# Patient Record
Sex: Female | Born: 1955 | Race: White | Hispanic: No | Marital: Married | Smoking: Former smoker
Health system: Southern US, Community
[De-identification: ages and names within clinical notes are randomized; demographics above are authoritative.]

## PROBLEM LIST (undated history)

## (undated) DIAGNOSIS — I10 Essential (primary) hypertension: Secondary | ICD-10-CM

## (undated) DIAGNOSIS — E119 Type 2 diabetes mellitus without complications: Secondary | ICD-10-CM

## (undated) DIAGNOSIS — K5790 Diverticulosis of intestine, part unspecified, without perforation or abscess without bleeding: Secondary | ICD-10-CM

## (undated) HISTORY — PX: TUBAL LIGATION: SHX77

## (undated) HISTORY — PX: ABDOMINAL SURGERY: SHX537

## (undated) HISTORY — DX: Diverticulosis of intestine, part unspecified, without perforation or abscess without bleeding: K57.90

---

## 2013-10-30 ENCOUNTER — Encounter: Payer: Self-pay | Admitting: Emergency Medicine

## 2013-10-30 ENCOUNTER — Emergency Department
Admission: EM | Admit: 2013-10-30 | Discharge: 2013-10-30 | Disposition: A | Discharge status: Home / Self Care | Payer: BC Managed Care – PPO | Source: Home / Self Care

## 2013-10-30 DIAGNOSIS — J209 Acute bronchitis, unspecified: Secondary | ICD-10-CM

## 2013-10-30 HISTORY — DX: Type 2 diabetes mellitus without complications: E11.9

## 2013-10-30 HISTORY — DX: Essential (primary) hypertension: I10

## 2013-10-30 MED ORDER — BENZONATATE 200 MG PO CAPS: 200.0000 mg | ORAL_CAPSULE | Freq: Every day | ORAL | Status: DC

## 2013-10-30 MED ORDER — AZITHROMYCIN 250 MG PO TABS: ORAL_TABLET | ORAL | Status: DC

## 2013-10-30 NOTE — ED Notes (Signed)
Cough, congestion, sneezing, hoarseness, bloody mucus x 6 days

## 2013-10-30 NOTE — ED Provider Notes (Signed)
CSN: [id number]     Arrival date & time 10/30/13  1443 History   None    Chief Complaint  Patient presents with  . URI      HPI Comments: Six days ago patient developed a non-productive cough suddenly without other symptoms.  She then developed a sore throat.  Three days later she developed sinus congestion, wheezing, and occasional shortness of breath.  She has had a burning sensation in her anterior chest.  She sometimes coughs until she gags.  She does not remember her last Tdap. She has a past history of several episodes of pneumonia.  The history is provided by the patient.    Past Medical History  Diagnosis Date  . Diabetes mellitus without complication   . Hypertension    Past Surgical History  Procedure Laterality Date  . Abdominal surgery    . Tubal ligation     No family history on file. History  Substance Use Topics  . Smoking status: Former Games developermoker  . Smokeless tobacco: Not on file  . Alcohol Use: Yes   OB History   Grav Para Term Preterm Abortions TAB SAB Ect Mult Living                 Review of Systems + sore throat + cough No pleuritic pain but has tightness in anterior chest + wheezing + nasal congestion + post-nasal drainage No sinus pain/pressure No itchy/red eyes No earache No hemoptysis + SOB No fever, ? chills No nausea No vomiting No abdominal pain No diarrhea No urinary symptoms No skin rash + fatigue ? myalgias + headache Used OTC meds without relief  Allergies  Review of patient's allergies indicates no known allergies.  Home Medications   Prior to Admission medications   Medication Sig Start Date End Date Taking? Authorizing Provider  aspirin 81 MG tablet Take 81 mg by mouth daily.   Yes Historical Provider, MD  calcium-vitamin D (OSCAL WITH D) 500-200 MG-UNIT per tablet Take 1 tablet by mouth.   Yes Historical Provider, MD  glipiZIDE (GLUCOTROL) 5 MG tablet Take by mouth daily before breakfast.   Yes Historical Provider,  MD  insulin detemir (LEVEMIR) 100 UNIT/ML injection Inject into the skin at bedtime.   Yes Historical Provider, MD  losartan (COZAAR) 50 MG tablet Take 50 mg by mouth daily.   Yes Historical Provider, MD  magnesium oxide (MAG-OX) 400 MG tablet Take 400 mg by mouth daily.   Yes Historical Provider, MD  metFORMIN (GLUCOPHAGE) 500 MG tablet Take by mouth 2 (two) times daily with a meal.   Yes Historical Provider, MD  vitamin B-12 (CYANOCOBALAMIN) 1000 MCG tablet Take 1,000 mcg by mouth daily.   Yes Historical Provider, MD  azithromycin (ZITHROMAX Z-PAK) 250 MG tablet Take 2 tabs today; then begin one tab once daily for 4 more days. 10/30/13   Lattie HawStephen A Beese, MD  benzonatate (TESSALON) 200 MG capsule Take 1 capsule (200 mg total) by mouth at bedtime. Take as needed for cough 10/30/13   Lattie HawStephen A Beese, MD   BP 150/87  Pulse 88  Temp(Src) 98.4 F (36.9 C) (Oral)  Ht 5\' 6"  (1.676 m)  Wt 174 lb (78.926 kg)  BMI 28.10 kg/m2  SpO2 96% Physical Exam Nursing notes and Vital Signs reviewed. Appearance:  Patient appears healthy, stated age, and in no acute distress Eyes:  Pupils are equal, round, and reactive to light and accomodation.  Extraocular movement is intact.  Conjunctivae are not inflamed  Ears:  Canals normal.  Tympanic membranes normal.  Nose:  Mildly congested turbinates.  No sinus tenderness.   Pharynx:  Normal Neck:  Supple.  Slightly tender shotty posterior nodes are palpated bilaterally  Lungs:  Clear to auscultation.  Breath sounds are equal.  Heart:  Regular rate and rhythm without murmurs, rubs, or gallops.  Abdomen:  Nontender without masses or hepatosplenomegaly.  Bowel sounds are present.  No CVA or flank tenderness.  Extremities:  No edema.  No calf tenderness Skin:  No rash present.   ED Course  Procedures  none   MDM   1. Acute bronchitis, unspecified organism; ?atypical agent    Begin Z-pack to cover atypicals.  Prescription written for Benzonatate [facility](Tessalon) to  take at bedtime for night-time cough.  Take plain Mucinex (1200 mg guaifenesin) twice daily for cough and congestion.  Increase fluid intake, rest. May use Afrin nasal spray (or generic oxymetazoline) twice daily for about 5 days.  Also recommend using saline nasal spray several times daily and saline nasal irrigation (AYR is a common brand) Try warm salt water gargles for sore throat.  Stop all antihistamines for now, and other non-prescription cough/cold preparations. Recommend a Tdap when well.  Follow-up with family doctor if not improving 7 to 10 days.     Lattie HawStephen A Beese, MD 10/30/13 2000

## 2013-10-30 NOTE — Discharge Instructions (Signed)
Take plain Mucinex (1200 mg guaifenesin) twice daily for cough and congestion.  Increase fluid intake, rest. May use Afrin nasal spray (or generic oxymetazoline) twice daily for about 5 days.  Also recommend using saline nasal spray several times daily and saline nasal irrigation (AYR is a common brand) Try warm salt water gargles for sore throat.  Stop all antihistamines for now, and other non-prescription cough/cold preparations. Recommend a Tdap when well.  Follow-up with family doctor if not improving 7 to 10 days.

## 2013-11-04 ENCOUNTER — Telehealth: Payer: Self-pay

## 2013-11-04 NOTE — ED Notes (Signed)
Left a message on voice mail asking how patient is feeling and advising to call back with any questions or concerns.  

## 2015-07-28 DIAGNOSIS — E119 Type 2 diabetes mellitus without complications: Secondary | ICD-10-CM | POA: Insufficient documentation

## 2015-07-28 DIAGNOSIS — I10 Essential (primary) hypertension: Secondary | ICD-10-CM | POA: Insufficient documentation

## 2015-07-28 DIAGNOSIS — E782 Mixed hyperlipidemia: Secondary | ICD-10-CM | POA: Insufficient documentation

## 2019-06-30 ENCOUNTER — Ambulatory Visit: Payer: Medicare Other | Attending: Internal Medicine

## 2019-06-30 DIAGNOSIS — Z23 Encounter for immunization: Secondary | ICD-10-CM | POA: Insufficient documentation

## 2019-06-30 NOTE — Progress Notes (Signed)
   Covid-19 Vaccination Clinic  Name:  Barbara Wagner    MRN: [id number] DOB: [birth year]  06/30/2019  [name] was observed post Covid-19 immunization for 15 minutes without incidence. She was provided with Vaccine Information Sheet and instruction to access the V-Safe system.   [name] was instructed to call 911 with any severe reactions post vaccine: Marland Kitchen Difficulty breathing  . Swelling of your face and throat  . A fast heartbeat  . A bad rash all over your body  . Dizziness and weakness    Immunizations Administered    Name Date Dose VIS Date Route   Pfizer COVID-19 Vaccine 06/30/2019  1:24 PM 0.3 mL 04/19/2019 Intramuscular   Manufacturer: [employer], [employer]   Lot: [id number]   NDC: [address]-9

## 2019-07-24 ENCOUNTER — Ambulatory Visit: Payer: Medicare Other | Attending: Internal Medicine

## 2019-07-24 DIAGNOSIS — Z23 Encounter for immunization: Secondary | ICD-10-CM

## 2019-07-24 NOTE — Progress Notes (Signed)
   Covid-19 Vaccination Clinic  Name:  Barbara Wagner    MRN: [id number] DOB: Sep 09, 1955  07/24/2019  [name] was observed post Covid-19 immunization for 15 minutes without incident. She was provided with Vaccine Information Sheet and instruction to access the V-Safe system.   [name] was instructed to call 911 with any severe reactions post vaccine: Marland Kitchen Difficulty breathing  . Swelling of face and throat  . A fast heartbeat  . A bad rash all over body  . Dizziness and weakness   Immunizations Administered    Name Date Dose VIS Date Route   Pfizer COVID-19 Vaccine 07/24/2019  1:10 PM 0.3 mL 04/19/2019 Intramuscular   Manufacturer: [employer], [employer]   Lot: [id number]   NDC: [address]-8

## 2019-10-22 DIAGNOSIS — T466X5A Adverse effect of antihyperlipidemic and antiarteriosclerotic drugs, initial encounter: Secondary | ICD-10-CM | POA: Insufficient documentation

## 2021-08-24 ENCOUNTER — Emergency Department (HOSPITAL_BASED_OUTPATIENT_CLINIC_OR_DEPARTMENT_OTHER): Payer: Medicare Other

## 2021-08-24 ENCOUNTER — Emergency Department (HOSPITAL_BASED_OUTPATIENT_CLINIC_OR_DEPARTMENT_OTHER)
Admission: EM | Admit: 2021-08-24 | Discharge: 2021-08-24 | Disposition: A | Discharge status: Home / Self Care | Payer: Medicare Other | Attending: Emergency Medicine | Admitting: Emergency Medicine

## 2021-08-24 ENCOUNTER — Other Ambulatory Visit (HOSPITAL_BASED_OUTPATIENT_CLINIC_OR_DEPARTMENT_OTHER): Payer: Self-pay

## 2021-08-24 ENCOUNTER — Other Ambulatory Visit: Payer: Self-pay

## 2021-08-24 ENCOUNTER — Encounter (HOSPITAL_BASED_OUTPATIENT_CLINIC_OR_DEPARTMENT_OTHER): Payer: Self-pay | Admitting: Emergency Medicine

## 2021-08-24 DIAGNOSIS — I10 Essential (primary) hypertension: Secondary | ICD-10-CM | POA: Insufficient documentation

## 2021-08-24 DIAGNOSIS — Z7984 Long term (current) use of oral hypoglycemic drugs: Secondary | ICD-10-CM | POA: Insufficient documentation

## 2021-08-24 DIAGNOSIS — Z79899 Other long term (current) drug therapy: Secondary | ICD-10-CM | POA: Insufficient documentation

## 2021-08-24 DIAGNOSIS — N3001 Acute cystitis with hematuria: Secondary | ICD-10-CM | POA: Diagnosis not present

## 2021-08-24 DIAGNOSIS — Z87891 Personal history of nicotine dependence: Secondary | ICD-10-CM | POA: Diagnosis not present

## 2021-08-24 DIAGNOSIS — E119 Type 2 diabetes mellitus without complications: Secondary | ICD-10-CM | POA: Insufficient documentation

## 2021-08-24 DIAGNOSIS — Z7982 Long term (current) use of aspirin: Secondary | ICD-10-CM | POA: Insufficient documentation

## 2021-08-24 DIAGNOSIS — R319 Hematuria, unspecified: Secondary | ICD-10-CM | POA: Diagnosis present

## 2021-08-24 DIAGNOSIS — Z794 Long term (current) use of insulin: Secondary | ICD-10-CM | POA: Diagnosis not present

## 2021-08-24 LAB — COMPREHENSIVE METABOLIC PANEL
ALT: 19 U/L (ref 0–44)
AST: 15 U/L (ref 15–41)
Albumin: 3.9 g/dL (ref 3.5–5.0)
Alkaline Phosphatase: 46 U/L (ref 38–126)
Anion gap: 9 (ref 5–15)
BUN: 12 mg/dL (ref 8–23)
CO2: 24 mmol/L (ref 22–32)
Calcium: 8.8 mg/dL — ABNORMAL LOW (ref 8.9–10.3)
Chloride: 105 mmol/L (ref 98–111)
Creatinine, Ser: 0.69 mg/dL (ref 0.44–1.00)
GFR, Estimated: >60 mL/min (ref >60)
Glucose, Bld: 193 mg/dL — ABNORMAL HIGH (ref 70–99)
Potassium: 4.2 mmol/L (ref 3.5–5.1)
Sodium: 138 mmol/L (ref 135–145)
Total Bilirubin: 0.7 mg/dL (ref 0.3–1.2)
Total Protein: 7 g/dL (ref 6.5–8.1)

## 2021-08-24 LAB — URINALYSIS, ROUTINE W REFLEX MICROSCOPIC
Bilirubin Urine: NEGATIVE
Glucose, UA: NEGATIVE mg/dL
Ketones, ur: NEGATIVE mg/dL
Nitrite: POSITIVE — AB
Protein, ur: NEGATIVE mg/dL
Specific Gravity, Urine: 1.025 (ref 1.005–1.030)
pH: 5.5 (ref 5.0–8.0)

## 2021-08-24 LAB — CBC WITH DIFFERENTIAL/PLATELET
Abs Immature Granulocytes: 0.02 10*3/uL (ref 0.00–0.07)
Basophils Absolute: 0 10*3/uL (ref 0.0–0.1)
Basophils Relative: 1 %
Eosinophils Absolute: 0.1 10*3/uL (ref 0.0–0.5)
Eosinophils Relative: 2 %
HCT: 42.2 % (ref 36.0–46.0)
Hemoglobin: 13.5 g/dL (ref 12.0–15.0)
Immature Granulocytes: 0 %
Lymphocytes Relative: 14 %
Lymphs Abs: 1 10*3/uL (ref 0.7–4.0)
MCH: 27.4 pg (ref 26.0–34.0)
MCHC: 32 g/dL (ref 30.0–36.0)
MCV: 85.6 fL (ref 80.0–100.0)
Monocytes Absolute: 0.6 10*3/uL (ref 0.1–1.0)
Monocytes Relative: 8 %
Neutro Abs: 5.1 10*3/uL (ref 1.7–7.7)
Neutrophils Relative %: 75 %
Platelets: 244 10*3/uL (ref 150–400)
RBC: 4.93 MIL/uL (ref 3.87–5.11)
RDW: 13.9 % (ref 11.5–15.5)
WBC: 6.9 10*3/uL (ref 4.0–10.5)
nRBC: 0 % (ref 0.0–0.2)

## 2021-08-24 LAB — URINALYSIS, MICROSCOPIC (REFLEX): RBC / HPF: >50 RBC/hpf (ref 0–5)

## 2021-08-24 LAB — LIPASE, BLOOD: Lipase: 28 U/L (ref 11–51)

## 2021-08-24 MED ORDER — SODIUM CHLORIDE 0.9 % IV SOLN
1.0000 g | Freq: Once | INTRAVENOUS | Status: AC
  Administered 2021-08-24: 1 g via INTRAVENOUS
  Filled 2021-08-24: qty 10

## 2021-08-24 MED ORDER — CEPHALEXIN 500 MG PO CAPS
500.0000 mg | ORAL_CAPSULE | Freq: Two times a day (BID) | ORAL | 0 refills | Status: AC
  Filled 2021-08-24: qty 14, 7d supply, fill #0

## 2021-08-24 NOTE — ED Triage Notes (Signed)
Pt reports hematuria and urinary frequency that started last night. Denies pain.  ?

## 2021-08-24 NOTE — ED Provider Notes (Signed)
?[facility] EMERGENCY DEPARTMENT ?Provider Note ? ? ?CSN: [id number] ?Arrival date & time: 08/24/21  1032 ? ?  ? ?History ? ?Chief Complaint  ?Patient presents with  ? Hematuria  ? ? ?Barbara Wagner is a 66 y.o. female. ? ?Patient here with some urinary frequency, some blood in her urine.  Denies any abdominal pain.  History of diabetes and high blood pressure.  Not on blood thinners.  Not having difficulty emptying her bladder.  Former smoker. Some intermittent left flank pain during the last 3 weeks ? ? ?The history is provided by the patient.  ?Hematuria ?This is a new problem. The current episode started yesterday. The problem has not changed since onset.Pertinent negatives include no chest pain, no abdominal pain, no headaches and no shortness of breath. Nothing aggravates the symptoms. Nothing relieves the symptoms. She has tried nothing for the symptoms. The treatment provided no relief.  ? ?  ? ?Home Medications ?Prior to Admission medications   ?Medication Sig Start Date End Date Taking? Authorizing Provider  ?cephALEXin (KEFLEX) 500 MG capsule Take 1 capsule (500 mg total) by mouth 2 (two) times daily for 7 days. 08/24/21 08/31/21 Yes Arleigh Dicola, DO  ?aspirin 81 MG tablet Take 81 mg by mouth daily.    [provider]  ?azithromycin (ZITHROMAX Z-PAK) 250 MG tablet Take 2 tabs today; then begin one tab once daily for 4 more days. 10/30/13   Lattie HawBeese, Stephen A, MD  ?benzonatate (TESSALON) 200 MG capsule Take 1 capsule (200 mg total) by mouth at bedtime. Take as needed for cough 10/30/13   Lattie HawBeese, Stephen A, MD  ?calcium-vitamin D (OSCAL WITH D) 500-200 MG-UNIT per tablet Take 1 tablet by mouth.    [provider]  ?glipiZIDE (GLUCOTROL) 5 MG tablet Take by mouth daily before breakfast.    [provider]  ?insulin detemir (LEVEMIR) 100 UNIT/ML injection Inject into the skin at bedtime.    [provider]  ?losartan (COZAAR) 50 MG tablet Take 50 mg by mouth daily.     [provider]  ?magnesium oxide (MAG-OX) 400 MG tablet Take 400 mg by mouth daily.    [provider]  ?metFORMIN (GLUCOPHAGE) 500 MG tablet Take by mouth 2 (two) times daily with a meal.    [provider]  ?vitamin B-12 (CYANOCOBALAMIN) 1000 MCG tablet Take 1,000 mcg by mouth daily.    [provider]  ?   ? ?Allergies    ?Patient has no known allergies.   ? ?Review of Systems   ?Review of Systems  ?Respiratory:  Negative for shortness of breath.   ?Cardiovascular:  Negative for chest pain.  ?Gastrointestinal:  Negative for abdominal pain.  ?Genitourinary:  Positive for hematuria.  ?Neurological:  Negative for headaches.  ? ?Physical Exam ?Updated Vital Signs ? ?ED Triage Vitals  ?Enc Vitals Group  ?   BP 08/24/21 1042 (!) 168/91  ?   Pulse Rate 08/24/21 1042 93  ?   Resp 08/24/21 1042 18  ?   Temp 08/24/21 1042 98.5 ?F (36.9 ?C)  ?   Temp Source 08/24/21 1042 Oral  ?   SpO2 08/24/21 1042 96 %  ?   Weight --   ?   Height --   ?   Head Circumference --   ?   Peak Flow --   ?   Pain Score 08/24/21 1042 0  ?   Pain Loc --   ?   Pain Edu? --   ?  Excl. in GC? --   ? ? ?Physical Exam ?Vitals and nursing note reviewed.  ?Constitutional:   ?   General: She is not in acute distress. ?   Appearance: She is well-developed. She is not ill-appearing.  ?HENT:  ?   Head: Normocephalic and atraumatic.  ?   Nose: Nose normal.  ?   Mouth/Throat:  ?   Mouth: Mucous membranes are moist.  ?Eyes:  ?   Extraocular Movements: Extraocular movements intact.  ?   Conjunctiva/sclera: Conjunctivae normal.  ?   Pupils: Pupils are equal, round, and reactive to light.  ?Cardiovascular:  ?   Rate and Rhythm: Normal rate and regular rhythm.  ?   Pulses: Normal pulses.  ?   Heart sounds: Normal heart sounds. No murmur heard. ?Pulmonary:  ?   Effort: Pulmonary effort is normal. No respiratory distress.  ?   Breath sounds: Normal breath sounds.  ?Abdominal:  ?   General: Abdomen is flat.  ?   Palpations:  Abdomen is soft.  ?   Tenderness: There is no abdominal tenderness.  ?Musculoskeletal:     ?   General: No swelling. Normal range of motion.  ?   Cervical back: Neck supple.  ?Skin: ?   General: Skin is warm and dry.  ?   Capillary Refill: Capillary refill takes less than 2 seconds.  ?Neurological:  ?   General: No focal deficit present.  ?   Mental Status: She is alert.  ?Psychiatric:     ?   Mood and Affect: Mood normal.  ? ? ?ED Results / Procedures / Treatments   ?Labs ?(all labs ordered are listed, but only abnormal results are displayed) ?Labs Reviewed  ?URINALYSIS, ROUTINE W REFLEX MICROSCOPIC - Abnormal; Notable for the following components:  ?    Result Value  ? APPearance HAZY (*)   ? Hgb urine dipstick LARGE (*)   ? Nitrite POSITIVE (*)   ? Leukocytes,Ua SMALL (*)   ? All other components within normal limits  ?COMPREHENSIVE METABOLIC PANEL - Abnormal; Notable for the following components:  ? Glucose, Bld 193 (*)   ? Calcium 8.8 (*)   ? All other components within normal limits  ?URINALYSIS, MICROSCOPIC (REFLEX) - Abnormal; Notable for the following components:  ? Bacteria, UA MANY (*)   ? All other components within normal limits  ?URINE CULTURE  ?CBC WITH DIFFERENTIAL/PLATELET  ?LIPASE, BLOOD  ? ? ?EKG ?None ? ?Radiology ?CT Renal Stone Study ? ?Result Date: 08/24/2021 ?CLINICAL DATA:  Left flank pain, kidney stone suspected. EXAM: CT ABDOMEN AND PELVIS WITHOUT CONTRAST TECHNIQUE: Multidetector CT imaging of the abdomen and pelvis was performed following the standard protocol without IV contrast. RADIATION DOSE REDUCTION: This exam was performed according to the departmental dose-optimization program which includes automated exposure control, adjustment of the mA and/or kV according to patient size and/or use of iterative reconstruction technique. COMPARISON:  None. FINDINGS: Lower chest: No acute abnormality. Hepatobiliary: No suspicious hepatic lesion on this noncontrast examination. Gallbladder is  unremarkable. No biliary ductal dilation. Pancreas: No pancreatic ductal dilation or evidence of acute inflammation. Spleen: No splenomegaly or focal splenic lesion. Adrenals/Urinary Tract: Bilateral adrenal glands are within normal limits. No hydronephrosis. No renal, ureteral or bladder calculi identified. Mild wall thickening of an incompletely distended urinary bladder. Stomach/Bowel: No enteric contrast was administered. Stomach is unremarkable for degree of distension. No pathologic dilation of small large bowel. The appendix and terminal ileum appear normal. Left-sided colonic diverticulosis without findings  of acute diverticulitis. Vascular/Lymphatic: Aortic and branch vessel atherosclerosis without abdominal aortic aneurysm. No pathologically enlarged abdominal or pelvic lymph nodes. Pelvic phleboliths. Reproductive: Uterus and bilateral adnexa are unremarkable. Other: No significant abdominopelvic free fluid. Musculoskeletal: Multilevel degenerative changes of the spine. Mild degenerative changes of the bilateral hips and SI joints. Chronic changes of the pubic symphysis. IMPRESSION: 1. No acute abdominopelvic findings. Specifically, no evidence of obstructive uropathy. 2. Left-sided colonic diverticulosis without findings of acute diverticulitis. 3. Mild wall thickening of an incompletely distended urinary bladder, which may represent cystitis. Correlate with urinalysis. 4.  Aortic Atherosclerosis (ICD10-I70.0). Electronically Signed   By: Maudry Mayhew M.D.   On: 08/24/2021 11:46   ? ?Procedures ?Procedures  ? ? ?Medications Ordered in ED ?Medications  ?cefTRIAXone (ROCEPHIN) 1 g in sodium chloride 0.9 % 100 mL IVPB (has no administration in time range)  ? ? ?ED Course/ Medical Decision Making/ A&P ?  ?                        ?Medical Decision Making ?Amount and/or Complexity of Data Reviewed ?Labs: ordered. ?Radiology: ordered. ? ?Risk ?Prescription drug management. ? ? ?Abiola Behring is a 66 year old  female who presents to the ED with hematuria and flank pain.  No history of kidney stones.  Recently treated for shingles.  History of diabetes, former smoker.  Overall she appears comfortable.  Not having any severe

## 2021-08-26 LAB — URINE CULTURE: Culture: >=100000 — AB

## 2022-08-17 LAB — ESTIMATED GFR: GFR, Est Non African American: 88

## 2022-08-17 LAB — MICROALBUMIN / CREATININE URINE RATIO
Creatinine, Urine.: 87
Microalb, Ur: <7
Microalb, Ur: 0

## 2022-09-12 ENCOUNTER — Ambulatory Visit (INDEPENDENT_AMBULATORY_CARE_PROVIDER_SITE_OTHER): Payer: Medicare Other | Admitting: Family Medicine

## 2022-09-12 ENCOUNTER — Other Ambulatory Visit: Payer: Self-pay | Admitting: Family Medicine

## 2022-09-12 ENCOUNTER — Encounter: Payer: Self-pay | Admitting: Family Medicine

## 2022-09-12 VITALS — BP 147/72 | HR 82 | Temp 97.9°F | Resp 18 | Ht 66.0 in | Wt 172.1 lb

## 2022-09-12 DIAGNOSIS — Z794 Long term (current) use of insulin: Secondary | ICD-10-CM | POA: Diagnosis not present

## 2022-09-12 DIAGNOSIS — E119 Type 2 diabetes mellitus without complications: Secondary | ICD-10-CM | POA: Diagnosis not present

## 2022-09-12 DIAGNOSIS — J3489 Other specified disorders of nose and nasal sinuses: Secondary | ICD-10-CM

## 2022-09-12 DIAGNOSIS — Z7689 Persons encountering health services in other specified circumstances: Secondary | ICD-10-CM

## 2022-09-12 DIAGNOSIS — Z23 Encounter for immunization: Secondary | ICD-10-CM | POA: Diagnosis not present

## 2022-09-12 NOTE — Progress Notes (Signed)
New Patient Office Visit  Subjective    Patient ID: Barbara Wagner, female    DOB: [birth year]  Age: 67 y.o. MRN: [id number]  CC:  Chief Complaint  Patient presents with   Establish Care    Patient states that she is a new patient here to establish care, She states that she doesn't have any major concerns at this time other than some post nasal drainage and a scratchy voice she states could be allergy related    HPI Barbara Wagner presents to establish care  Pt reports she has been diabetic since her 14s. She is on Basaglar 26 units BID and Metformin 1000mg  BID. She is using aspirin 81 mg daily. She is seeing endocrinologist every 4 months. She has had A1c at 7.3 last month. She sees [facility] for diabetic eye exam and had this done in March 2024. She reports this was good.   She reports she has postnasal drainage. This is causing her voice to be hoarse. She isn't on any medicine for allergies.   She reports hematuria last year. She went to ER and had CT scan. She was diagnosed with UTI and given Keflex.   Pt would like PCV-20, she declines Colonoscopy, Mammogram.   Outpatient Encounter Medications as of 09/12/2022  Medication Sig   aspirin 81 MG tablet Take 81 mg by mouth daily.   Blood Glucose Monitoring Suppl (GLUCOCOM BLOOD GLUCOSE MONITOR) DEVI USE TO TEST 4 TIMES DAILY. DX CODE E11.9   calcium-vitamin D (OSCAL WITH D) 500-200 MG-UNIT per tablet Take 1 tablet by mouth.   CRANBERRY PO Take by mouth.   Insulin Glargine (BASAGLAR KWIKPEN) 100 UNIT/ML Inject 30 Units into the skin 2 (two) times daily.   losartan (COZAAR) 50 MG tablet Take 50 mg by mouth daily.   magnesium oxide (MAG-OX) 400 MG tablet Take 400 mg by mouth daily.   metFORMIN (GLUCOPHAGE) 500 MG tablet Take 1,000 mg by mouth 2 (two) times daily with a meal.   Multiple Vitamins-Minerals (PRESERVISION AREDS 2 PO) Take by mouth.   TURMERIC PO Take by mouth.   vitamin B-12 (CYANOCOBALAMIN) 1000 MCG tablet Take 1,000 mcg by mouth  daily.   [DISCONTINUED] azithromycin (ZITHROMAX Z-PAK) 250 MG tablet Take 2 tabs today; then begin one tab once daily for 4 more days.   [DISCONTINUED] benzonatate (TESSALON) 200 MG capsule Take 1 capsule (200 mg total) by mouth at bedtime. Take as needed for cough   [DISCONTINUED] glipiZIDE (GLUCOTROL) 5 MG tablet Take by mouth daily before breakfast.   [DISCONTINUED] insulin detemir (LEVEMIR) 100 UNIT/ML injection Inject into the skin at bedtime.   [DISCONTINUED] metFORMIN (GLUCOPHAGE) 500 MG tablet Take by mouth 2 (two) times daily with a meal.   No facility-administered encounter medications on file as of 09/12/2022.    Past Medical History:  Diagnosis Date   Diabetes mellitus without complication (HCC)    Diverticulosis    Hypertension     Past Surgical History:  Procedure Laterality Date   ABDOMINAL SURGERY     TUBAL LIGATION      History reviewed. No pertinent family history.  Social History   Socioeconomic History   Marital status: Married    Spouse name: Not on file   Number of children: Not on file   Years of education: Not on file   Highest education level: Not on file  Occupational History   Not on file  Tobacco Use   Smoking status: Former   Smokeless tobacco: Not on  file  Substance and Sexual Activity   Alcohol use: Yes   Drug use: Not on file   Sexual activity: Not on file  Other Topics Concern   Not on file  Social History Narrative   Not on file   Social Determinants of Health   Financial Resource Strain: Not on file  Food Insecurity: Not on file  Transportation Needs: Not on file  Physical Activity: Not on file  Stress: Not on file  Social Connections: Not on file  Intimate Partner Violence: Not on file    Review of Systems  HENT:         Postnasal drainage  All other systems reviewed and are negative.      Objective    BP (!) 147/72   Pulse 82   Temp 97.9 F (36.6 C) (Oral)   Resp 18   Ht 5\' 6"  (1.676 m)   Wt 172 lb 1.6 oz  (78.1 kg)   SpO2 98%   BMI 27.78 kg/m   Physical Exam Vitals and nursing note reviewed.  Constitutional:      Appearance: Normal appearance. She is normal weight.  HENT:     Head: Normocephalic and atraumatic.     Right Ear: External ear normal.     Left Ear: External ear normal.     Nose: Nose normal.     Mouth/Throat:     Mouth: Mucous membranes are moist.     Pharynx: Oropharynx is clear.  Eyes:     Conjunctiva/sclera: Conjunctivae normal.     Pupils: Pupils are equal, round, and reactive to light.  Cardiovascular:     Rate and Rhythm: Normal rate and regular rhythm.     Pulses: Normal pulses.     Heart sounds: Normal heart sounds.  Pulmonary:     Effort: Pulmonary effort is normal.     Breath sounds: Normal breath sounds.  Abdominal:     General: Abdomen is flat. Bowel sounds are normal.  Skin:    General: Skin is warm.     Capillary Refill: Capillary refill takes less than 2 seconds.  Neurological:     General: No focal deficit present.     Mental Status: She is alert and oriented to person, place, and time. Mental status is at baseline.  Psychiatric:        Mood and Affect: Mood normal.        Behavior: Behavior normal.        Thought Content: Thought content normal.        Judgment: Judgment normal.       Assessment & Plan:   Problem List Items Addressed This Visit   None  Encounter to establish care with new doctor  Type 2 diabetes mellitus without complication, with long-term current use of insulin (HCC)  Need for vaccination against Streptococcus pneumoniae -     Pneumococcal conjugate vaccine 20-valent  Sinus drainage  PCV-20 today Pt to continue to see Endocrinologist for routine follow up of her Diabetes May add flonase for Sinus/postnasal drainage prn.  To see back in 4 weeks for AWV.  No follow-ups on file.   Suzan Slick, MD

## 2022-10-11 ENCOUNTER — Encounter: Payer: Self-pay | Admitting: Family Medicine

## 2022-10-11 ENCOUNTER — Ambulatory Visit (INDEPENDENT_AMBULATORY_CARE_PROVIDER_SITE_OTHER): Payer: Medicare Other | Admitting: Family Medicine

## 2022-10-11 VITALS — BP 127/79 | HR 81 | Temp 97.7°F | Resp 18 | Ht 66.0 in | Wt 172.6 lb

## 2022-10-11 DIAGNOSIS — Z0000 Encounter for general adult medical examination without abnormal findings: Secondary | ICD-10-CM | POA: Diagnosis not present

## 2022-10-11 DIAGNOSIS — Z1211 Encounter for screening for malignant neoplasm of colon: Secondary | ICD-10-CM

## 2022-10-11 NOTE — Progress Notes (Signed)
Annual Wellness Visit     Patient: Barbara Wagner, Female    DOB: 19-May-1955, 67 y.o.   MRN: [id number]  Subjective  Chief Complaint  Patient presents with   Medicare Annual wellness exam    Patient states that she has a lot of nasal congestion she states that she can't seem to get rid of.    Barbara Wagner is a 67 y.o. female who presents today for her Annual Wellness Visit. She reports consuming a  diabetic  diet. The patient does not participate in regular exercise at present. She generally feels well. She reports sleeping well. She does not have additional problems to discuss today.   HPI Pt declines mammogram. She reports she has had cyst of her left breast that was biopsied. She had a bad experience and didn't want to have anymore mammograms.  She would like to do Cologuard. She does report a change in bowel movements in the last 3 months. She reports hemorrhoids and fissures many years ago.  Pt does drink water but not active. Declines dexa scan.  Vision:Within last year  Opioid Use/abuse: no opioid usage  Patient Active Problem List   Diagnosis Date Noted   Myalgia due to statin 10/22/2019   Essential hypertension 07/28/2015   Mixed hyperlipidemia 07/28/2015   Type 2 diabetes mellitus without complication, with long-term current use of insulin (HCC) 07/28/2015   Past Medical History:  Diagnosis Date   Diabetes mellitus without complication (HCC)    Diverticulosis    Hypertension    Past Surgical History:  Procedure Laterality Date   ABDOMINAL SURGERY     TUBAL LIGATION     Social History   Tobacco Use   Smoking status: Former  Substance Use Topics   Alcohol use: Yes   Family Status  Relation Name Status   Mother  Deceased   Father  Deceased   Family History  Problem Relation Age of Onset   Stroke Father    Allergies  Allergen Reactions   Pollen Extract Other (See Comments)      Medications: Outpatient Medications Prior to Visit  Medication Sig    aspirin 81 MG tablet Take 81 mg by mouth daily.   Blood Glucose Monitoring Suppl (GLUCOCOM BLOOD GLUCOSE MONITOR) DEVI USE TO TEST 4 TIMES DAILY. DX CODE E11.9   calcium-vitamin D (OSCAL WITH D) 500-200 MG-UNIT per tablet Take 1 tablet by mouth.   CRANBERRY PO Take by mouth.   Insulin Glargine (BASAGLAR KWIKPEN) 100 UNIT/ML Inject 30 Units into the skin 2 (two) times daily.   losartan (COZAAR) 50 MG tablet Take 50 mg by mouth daily.   magnesium oxide (MAG-OX) 400 MG tablet Take 400 mg by mouth daily.   metFORMIN (GLUCOPHAGE) 500 MG tablet Take 1,000 mg by mouth 2 (two) times daily with a meal.   Multiple Vitamins-Minerals (PRESERVISION AREDS 2 PO) Take by mouth.   TURMERIC PO Take by mouth.   vitamin B-12 (CYANOCOBALAMIN) 1000 MCG tablet Take 1,000 mcg by mouth daily.   No facility-administered medications prior to visit.    Allergies  Allergen Reactions   Pollen Extract Other (See Comments)    Patient Care Team: Suzan Slick, MD as PCP - General (Family Medicine)  Review of Systems  HENT:  Positive for congestion and sinus pain.   Genitourinary:        Change in bowel movements  All other systems reviewed and are negative.       Objective  BP 127/79  Pulse 81   Temp 97.7 F (36.5 C) (Oral)   Resp 18   Ht 5\' 6"  (1.676 m)   Wt 172 lb 9.6 oz (78.3 kg)   SpO2 96%   BMI 27.86 kg/m  BP Readings from Last 3 Encounters:  10/11/22 127/79  09/12/22 (!) 147/72  08/24/21 (!) 145/74      Physical Exam Vitals and nursing note reviewed.  Constitutional:      Appearance: Normal appearance. She is normal weight.  HENT:     Head: Normocephalic and atraumatic.     Right Ear: External ear normal.     Left Ear: External ear normal.     Nose: Nose normal.     Mouth/Throat:     Mouth: Mucous membranes are moist.     Pharynx: Oropharynx is clear.  Eyes:     Conjunctiva/sclera: Conjunctivae normal.     Pupils: Pupils are equal, round, and reactive to light.   Cardiovascular:     Rate and Rhythm: Normal rate and regular rhythm.     Pulses: Normal pulses.     Heart sounds: Normal heart sounds.  Pulmonary:     Effort: Pulmonary effort is normal.     Breath sounds: Normal breath sounds.  Abdominal:     General: Abdomen is flat. Bowel sounds are normal.  Skin:    General: Skin is warm.     Capillary Refill: Capillary refill takes less than 2 seconds.  Neurological:     General: No focal deficit present.     Mental Status: She is alert and oriented to person, place, and time. Mental status is at baseline.  Psychiatric:        Mood and Affect: Mood normal.        Behavior: Behavior normal.        Thought Content: Thought content normal.        Judgment: Judgment normal.      Most recent functional status assessment:     No data to display         Most recent fall risk assessment:    09/12/2022    9:07 AM  Fall Risk   Falls in the past year? 0  Number falls in past yr: 0  Injury with Fall? 0  Risk for fall due to : No Fall Risks  Follow up Falls evaluation completed    Most recent depression screenings:    09/12/2022    9:08 AM  PHQ 2/9 Scores  PHQ - 2 Score 0  PHQ- 9 Score 3   Most recent cognitive screening:     No data to display         Most recent Audit-C alcohol use screening     No data to display         A score of 3 or more in women, and 4 or more in men indicates increased risk for alcohol abuse, EXCEPT if all of the points are from question 1   Vision/Hearing Screen: No results found.  Last CBC Lab Results  Component Value Date   WBC 6.9 08/24/2021   HGB 13.5 08/24/2021   HCT 42.2 08/24/2021   MCV 85.6 08/24/2021   MCH 27.4 08/24/2021   RDW 13.9 08/24/2021   PLT 244 08/24/2021   Last metabolic panel Lab Results  Component Value Date   GLUCOSE 193 (H) 08/24/2021   NA 138 08/24/2021   K 4.2 08/24/2021   CL 105 08/24/2021   CO2 24 08/24/2021  BUN 12 08/24/2021   CREATININE 0.69  08/24/2021   GFRNONAA 88 08/17/2022   CALCIUM 8.8 (L) 08/24/2021   PROT 7.0 08/24/2021   ALBUMIN 3.9 08/24/2021   BILITOT 0.7 08/24/2021   ALKPHOS 46 08/24/2021   AST 15 08/24/2021   ALT 19 08/24/2021   ANIONGAP 9 08/24/2021   Last lipids No results found for: "CHOL", "HDL", "LDLCALC", "LDLDIRECT", "TRIG", "CHOLHDL" Last hemoglobin A1c No results found for: "HGBA1C"    No results found for any visits on 10/11/22.    Assessment & Plan   Annual wellness visit done today including the all of the following: Reviewed patient's Family Medical History Reviewed and updated list of patient's medical providers Assessment of cognitive impairment was done Assessed patient's functional ability Established a written schedule for health screening services Health Risk Assessent Completed and Reviewed  Exercise Activities and Dietary recommendations  Goals   None     Immunization History  Administered Date(s) Administered   PFIZER(Purple Top)SARS-COV-2 Vaccination 06/30/2019, 07/24/2019   PNEUMOCOCCAL CONJUGATE-20 09/12/2022    Health Maintenance  Topic Date Due   Hepatitis C Screening  Never done   DTaP/Tdap/Td (1 - Tdap) Never done   Zoster Vaccines- Shingrix (1 of 2) Never done   DEXA SCAN  Never done   COVID-19 Vaccine (3 - 2023-24 season) 01/07/2022   MAMMOGRAM  09/12/2023 (Originally 12/28/2003)   Colonoscopy  09/12/2023 (Originally 12/28/1998)   INFLUENZA VACCINE  12/08/2022   HEMOGLOBIN A1C  02/21/2023   OPHTHALMOLOGY EXAM  08/05/2023   Diabetic kidney evaluation - eGFR measurement  08/17/2023   Diabetic kidney evaluation - Urine ACR  08/17/2023   FOOT EXAM  08/22/2023   Medicare Annual Wellness (AWV)  10/11/2023   Pneumonia Vaccine 24+ Years old  Completed   HPV VACCINES  Aged Out     Discussed health benefits of physical activity, and encouraged her to engage in regular exercise appropriate for her age and condition.    Problem List Items Addressed This Visit    None Visit Diagnoses     Medicare annual wellness visit, subsequent    -  Primary   Encounter for osteoporosis screening in asymptomatic postmenopausal patient       Screening for colon cancer       Relevant Orders   Cologuard     Labs are up to date, just had done with Endo in April reviewed and abstracted. Send cologuard to home Declines vaccines including shingles and covid Declines mammogram and dexa scan at this time  No follow-ups on file.     Suzan Slick, MD

## 2023-10-10 LAB — COMPREHENSIVE METABOLIC PANEL WITH GFR: eGFR: 80

## 2023-10-10 LAB — PROTEIN / CREATININE RATIO, URINE
Albumin, U: 26
Creatinine, Urine: 180

## 2023-10-10 LAB — MICROALBUMIN / CREATININE URINE RATIO: Microalb Creat Ratio: 14

## 2023-10-16 LAB — HEMOGLOBIN A1C: Hemoglobin A1C: 7.8

## 2023-11-06 ENCOUNTER — Other Ambulatory Visit: Payer: Self-pay | Admitting: Family Medicine

## 2023-11-06 ENCOUNTER — Ambulatory Visit (INDEPENDENT_AMBULATORY_CARE_PROVIDER_SITE_OTHER): Admitting: Family Medicine

## 2023-11-06 ENCOUNTER — Encounter: Payer: Self-pay | Admitting: Family Medicine

## 2023-11-06 VITALS — BP 127/75 | HR 79 | Temp 98.2°F | Resp 18 | Ht 63.0 in | Wt 174.6 lb

## 2023-11-06 DIAGNOSIS — Z0000 Encounter for general adult medical examination without abnormal findings: Secondary | ICD-10-CM | POA: Diagnosis not present

## 2023-11-06 NOTE — Progress Notes (Signed)
 Subjective:   Barbara Wagner is a 68 y.o. female who presents for Medicare Annual (Subsequent) preventive examination.  Visit Complete: In person  Patient Medicare AWV questionnaire was completed by the patient on 11/06/23; I have confirmed that all information answered by patient is correct and no changes since this date.  Cardiac Risk Factors include: diabetes mellitus;hypertension     Objective:    Today's Vitals   11/06/23 1341  BP: 127/75  Pulse: 79  Resp: 18  Temp: 98.2 F (36.8 C)  TempSrc: Oral  SpO2: 95%  Weight: 174 lb 9.6 oz (79.2 kg)  Height: 5' 3 (1.6 m)   Body mass index is 30.93 kg/m.     11/06/2023    1:40 PM 09/12/2022    9:08 AM 08/24/2021   10:42 AM  Advanced Directives  Does Patient Have a Medical Advance Directive? Yes Yes No  Type of Estate agent of [place];Living will Healthcare Power of [place];Living will   Does patient want to make changes to medical advance directive? No - Patient declined    Copy of Healthcare Power of Attorney in Chart? No - copy requested      Current Medications (verified) Outpatient Encounter Medications as of 11/06/2023  Medication Sig   aspirin 81 MG tablet Take 81 mg by mouth daily.   Blood Glucose Monitoring Suppl (GLUCOCOM BLOOD GLUCOSE MONITOR) DEVI USE TO TEST 4 TIMES DAILY. DX CODE E11.9   Cholecalciferol (VITAMIN D-3 PO) Take by mouth.   CRANBERRY PO Take by mouth.   folic acid-vitamin b complex-vitamin c-selenium-zinc (DIALYVITE) 3 MG TABS tablet Take 1 tablet by mouth daily.   Insulin Glargine (BASAGLAR KWIKPEN) 100 UNIT/ML Inject 30 Units into the skin 2 (two) times daily.   losartan (COZAAR) 50 MG tablet Take 50 mg by mouth daily.   magnesium oxide (MAG-OX) 400 MG tablet Take 400 mg by mouth daily.   metFORMIN (GLUCOPHAGE) 500 MG tablet Take 1,000 mg by mouth 2 (two) times daily with a meal.   Multiple Vitamins-Minerals (PRESERVISION AREDS 2 PO) Take by mouth.   TURMERIC PO Take by  mouth.   vitamin B-12 (CYANOCOBALAMIN) 1000 MCG tablet Take 1,000 mcg by mouth daily.   [DISCONTINUED] calcium-vitamin D (OSCAL WITH D) 500-200 MG-UNIT per tablet Take 1 tablet by mouth.   No facility-administered encounter medications on file as of 11/06/2023.    Allergies (verified) Pollen extract   History: Past Medical History:  Diagnosis Date   Diabetes mellitus without complication (HCC)    Diverticulosis    Hypertension    Past Surgical History:  Procedure Laterality Date   ABDOMINAL SURGERY     TUBAL LIGATION     Family History  Problem Relation Age of Onset   Graves' disease Mother    Stroke Father    Social History   Socioeconomic History   Marital status: Married    Spouse name: Not on file   Number of children: Not on file   Years of education: Not on file   Highest education level: Some college, no degree  Occupational History   Not on file  Tobacco Use   Smoking status: Former   Smokeless tobacco: Not on file  Substance and Sexual Activity   Alcohol use: Yes   Drug use: Not on file   Sexual activity: Not on file  Other Topics Concern   Not on file  Social History Narrative   Not on file   Social Drivers of Health   Financial  Resource Strain: Low Risk  (11/05/2023)   Overall Financial Resource Strain (CARDIA)    Difficulty of Paying Living Expenses: Not hard at all  Food Insecurity: No Food Insecurity (11/05/2023)   Hunger Vital Sign    Worried About Running Out of Food in the Last Year: Never true    Ran Out of Food in the Last Year: Never true  Transportation Needs: No Transportation Needs (11/05/2023)   PRAPARE - Administrator, Civil Service (Medical): No    Lack of Transportation (Non-Medical): No  Physical Activity: Insufficiently Active (11/05/2023)   Exercise Vital Sign    Days of Exercise per Week: 3 days    Minutes of Exercise per Session: 20 min  Stress: Stress Concern Present (11/05/2023)   [employer] of  Occupational Health - Occupational Stress Questionnaire    Feeling of Stress: To some extent  Social Connections: Moderately Integrated (11/05/2023)   Social Connection and Isolation Panel    Frequency of Communication with Friends and Family: More than three times a week    Frequency of Social Gatherings with Friends and Family: More than three times a week    Attends Religious Services: 1 to 4 times per year    Active Member of [employer] or Organizations: No    Attends Engineer, structural: Not on file    Marital Status: Married    Tobacco Counseling Counseling given: Not Answered   Clinical Intake:  Pre-visit preparation completed: Yes  Pain : No/denies pain     BMI - recorded: 27 Nutritional Status: BMI 25 -29 Overweight Nutritional Risks: None Diabetes: Yes CBG done?: Yes CBG resulted in Enter/ Edit results?: Yes Did pt. bring in CBG monitor from home?: Yes Glucose Meter Downloaded?: No  How often do you need to have someone help you when you read instructions, pamphlets, or other written materials from your doctor or pharmacy?: 1 - Never What is the last grade level you completed in school?: 1 year college  Interpreter Needed?: No      Activities of Daily Living    11/06/2023    1:39 PM 11/05/2023   11:52 PM  In your present state of health, do you have any difficulty performing the following activities:  Hearing? 0 0  Comment left ear hearing loss   Vision? 0 0  Difficulty concentrating or making decisions? 0 0  Walking or climbing stairs? 0 0  Dressing or bathing? 0 0  Doing errands, shopping? 0 0  Preparing Food and eating ? N N  Using the Toilet? N N  In the past six months, have you accidently leaked urine? Y Y  Do you have problems with loss of bowel control? N N  Managing your Medications? N N  Managing your Finances? N N  Housekeeping or managing your Housekeeping? N N    Patient Care Team: Colette Torrence GRADE, MD as PCP - General (Family  Medicine)  Indicate any recent Medical Services you may have received from other than [facility] providers in the past year (date may be approximate).     Assessment:   This is a routine wellness examination for Barbara Wagner.  Hearing/Vision screen No results found.   Goals Addressed             This Visit's Progress    Patient Stated       Enjoy the time I have left with family and friends       Depression Screen    11/06/2023  1:37 PM 09/12/2022    9:08 AM  PHQ 2/9 Scores  PHQ - 2 Score 1 0  PHQ- 9 Score  3    Fall Risk    11/06/2023    1:41 PM 11/05/2023   11:52 PM 09/12/2022    9:07 AM  Fall Risk   Falls in the past year? 0 0 0  Number falls in past yr: 0 0 0  Injury with Fall? 0 0 0  Risk for fall due to : No Fall Risks  No Fall Risks  Follow up Falls evaluation completed  Falls evaluation completed    MEDICARE RISK AT HOME: Medicare Risk at Home Any stairs in or around the home?: Yes If so, are there any without handrails?: Yes Home free of loose throw rugs in walkways, pet beds, electrical cords, etc?: Yes Adequate lighting in your home to reduce risk of falls?: Yes Life alert?: No Use of a cane, walker or w/c?: No Grab bars in the bathroom?: Yes Shower chair or bench in shower?: Yes Elevated toilet seat or a handicapped toilet?: No  TIMED UP AND GO:  Was the test performed?  Yes  Length of time to ambulate 10 feet: 8 sec Gait steady and fast without use of assistive device    Cognitive Function:        11/06/2023    1:42 PM  6CIT Screen  What Year? 0 points  What month? 0 points  What time? 0 points  Count back from 20 0 points  Months in reverse 0 points  Repeat phrase 0 points  Total Score 0 points    Immunizations Immunization History  Administered Date(s) Administered   PFIZER(Purple Top)SARS-COV-2 Vaccination 06/30/2019, 07/24/2019   PNEUMOCOCCAL CONJUGATE-20 09/12/2022    TDAP status: Up to date  Flu Vaccine status: Up to  date  Pneumococcal vaccine status: Up to date  Covid-19 vaccine status: Completed vaccines  Qualifies for Shingles Vaccine? No   Zostavax completed No   Shingrix Completed?: No.    Education has been provided regarding the importance of this vaccine. Patient has been advised to call insurance company to determine out of pocket expense if they have not yet received this vaccine. Advised may also receive vaccine at local pharmacy or Health Dept. Verbalized acceptance and understanding.  Screening Tests Health Maintenance  Topic Date Due   Colonoscopy  Never done   MAMMOGRAM  Never done   Zoster Vaccines- Shingrix (1 of 2) Never done   OPHTHALMOLOGY EXAM  08/05/2023   FOOT EXAM  08/22/2023   INFLUENZA VACCINE  12/08/2023   HEMOGLOBIN A1C  04/16/2024   Diabetic kidney evaluation - eGFR measurement  10/09/2024   Diabetic kidney evaluation - Urine ACR  10/09/2024   Medicare Annual Wellness (AWV)  11/05/2024   Pneumococcal Vaccine: 50+ Years  Completed   Hepatitis B Vaccines  Aged Out   HPV VACCINES  Aged Out   Meningococcal B Vaccine  Aged Out   DTaP/Tdap/Td  Discontinued   DEXA SCAN  Discontinued   COVID-19 Vaccine  Discontinued   Hepatitis C Screening  Discontinued    Health Maintenance  Health Maintenance Due  Topic Date Due   Colonoscopy  Never done   MAMMOGRAM  Never done   Zoster Vaccines- Shingrix (1 of 2) Never done   OPHTHALMOLOGY EXAM  08/05/2023   FOOT EXAM  08/22/2023    Colorectal cancer screening: No longer required.  Pt declines  Mammogram status: No longer required due to pt  declines.  Dexa: pt declined  Lung Cancer Screening: (Low Dose CT Chest recommended if Age 65-80 years, 20 pack-year currently smoking OR have quit w/in 15years.) does not qualify.   Lung Cancer Screening Referral: N/A  Additional Screening:  Hepatitis C Screening: does not qualify; Completed N/A  Vision Screening: Recommended annual ophthalmology exams for early detection of  glaucoma and other disorders of the eye. Is the patient up to date with their annual eye exam?  Yes  Who is the provider or what is the name of the office in which the patient attends annual eye exams? 07/13/23 If pt is not established with a provider, would they like to be referred to a provider to establish care? No .   Dental Screening: Recommended annual dental exams for proper oral hygiene  Diabetic Foot Exam: Diabetic Foot Exam: Completed 11/06/23  Community Resource Referral / Chronic Care Management: CRR required this visit?  No   CCM required this visit?  No     Plan:     I have personally reviewed and noted the following in the patient's chart:   Medical and social history Use of alcohol, tobacco or illicit drugs  Current medications and supplements including opioid prescriptions. Patient is not currently taking opioid prescriptions. Functional ability and status Nutritional status Physical activity Advanced directives List of other physicians Hospitalizations, surgeries, and ER visits in previous 12 months Vitals Screenings to include cognitive, depression, and falls Referrals and appointments  In addition, I have reviewed and discussed with patient certain preventive protocols, quality metrics, and best practice recommendations. A written personalized care plan for preventive services as well as general preventive health recommendations were provided to patient.     Torrence CINDERELLA Barrier, MD   11/06/2023   After Visit Summary: (In Person-Printed) AVS printed and given to the patient  Nurse Notes: N/A

## 2023-11-06 NOTE — Patient Instructions (Signed)
  Ms. Hoge , Thank you for taking time to come for your Medicare Wellness Visit. I appreciate your ongoing commitment to your health goals. Please review the following plan we discussed and let me know if I can assist you in the future.   These are the goals we discussed:  Goals      Patient Stated     Enjoy the time I have left with family and friends        This is a list of the screening recommended for you and due dates:  Health Maintenance  Topic Date Due   Zoster (Shingles) Vaccine (1 of 2) 02/06/2024*   Mammogram  11/05/2024*   Colon Cancer Screening  11/05/2024*   Flu Shot  12/08/2023   Hemoglobin A1C  04/16/2024   Eye exam for diabetics  07/12/2024   Yearly kidney function blood test for diabetes  10/09/2024   Yearly kidney health urinalysis for diabetes  10/09/2024   Complete foot exam   11/05/2024   Medicare Annual Wellness Visit  11/05/2024   Pneumococcal Vaccine for age over 62  Completed   Hepatitis B Vaccine  Aged Out   HPV Vaccine  Aged Out   Meningitis B Vaccine  Aged Out   DTaP/Tdap/Td vaccine  Discontinued   DEXA scan (bone density measurement)  Discontinued   COVID-19 Vaccine  Discontinued   Hepatitis C Screening  Discontinued  *Topic was postponed. The date shown is not the original due date.

## 2024-03-19 IMAGING — CT CT RENAL STONE PROTOCOL
2 of 4 series · 16 of 46 positions shown, 18 images · non-contrast
Comparison: None.

CLINICAL DATA: Left flank pain, kidney stone suspected.



[Series 2: axial st · axial · 0.94mm/px · z∈[-478,-38]mm · 13 of 98 slices shown, 15 images]
[im 5/98  soft-tissue]
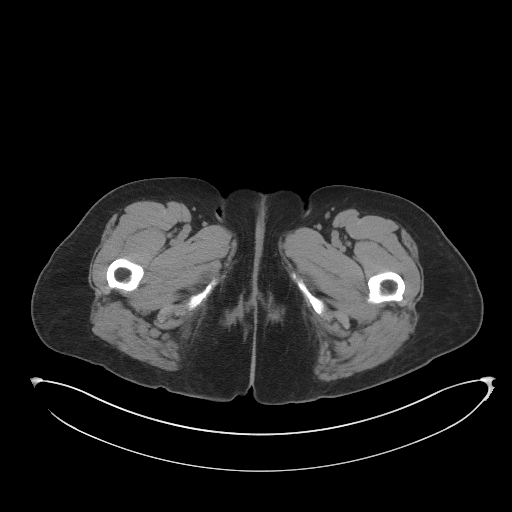
[im 5/98  bone]
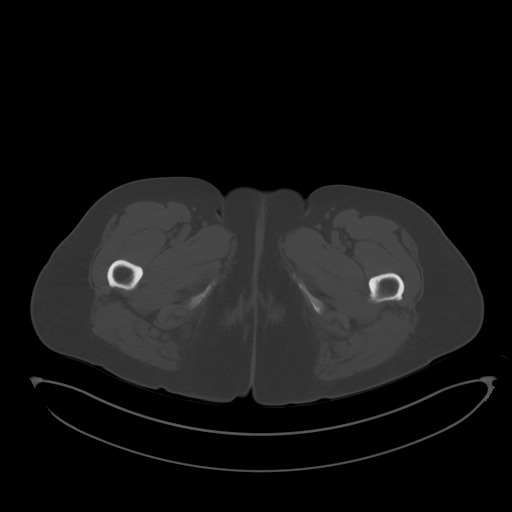
[im 13/98  soft-tissue]
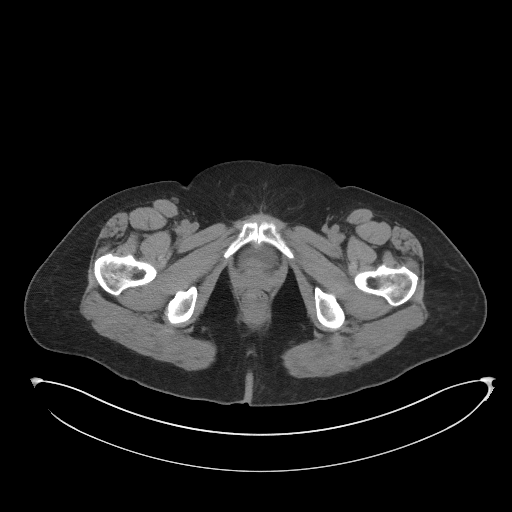
[im 22/98  soft-tissue]
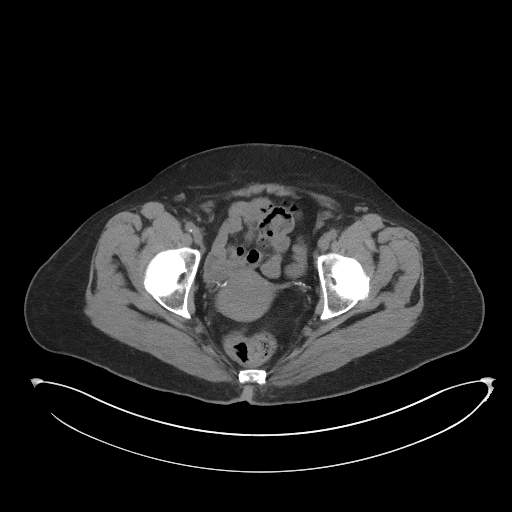
[im 26/98  soft-tissue]
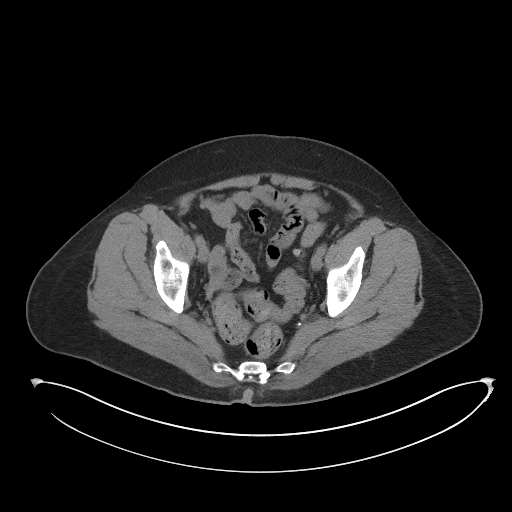
[im 34/98  soft-tissue]
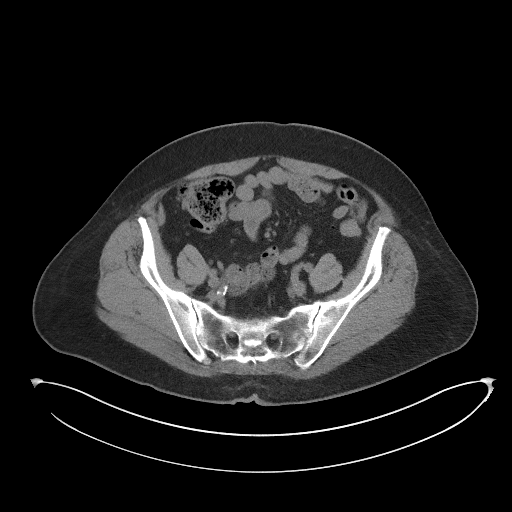
[im 43/98  soft-tissue]
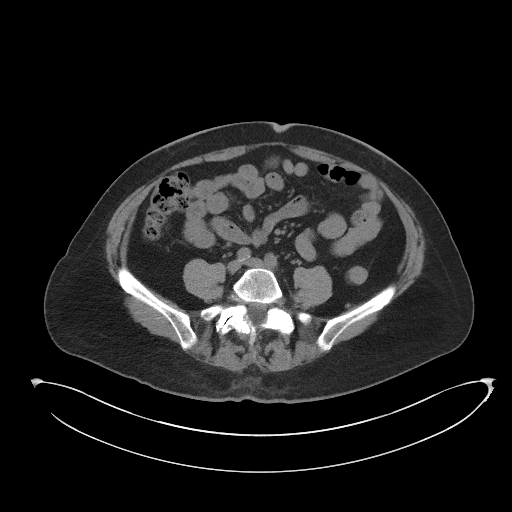
[im 51/98  soft-tissue]
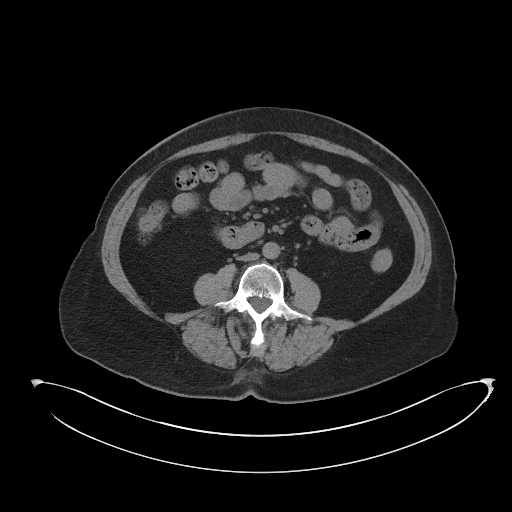
[im 55/98  soft-tissue]
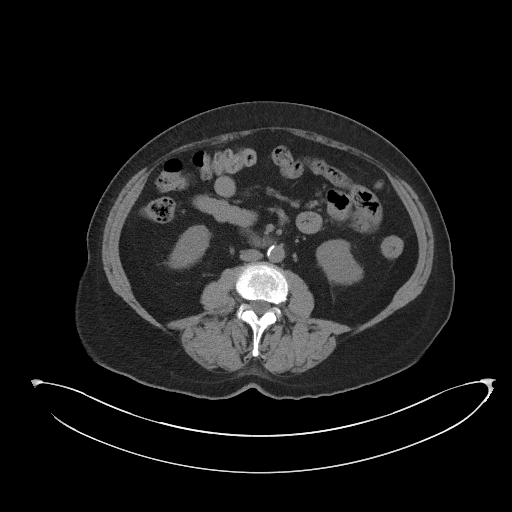
[im 64/98  soft-tissue]
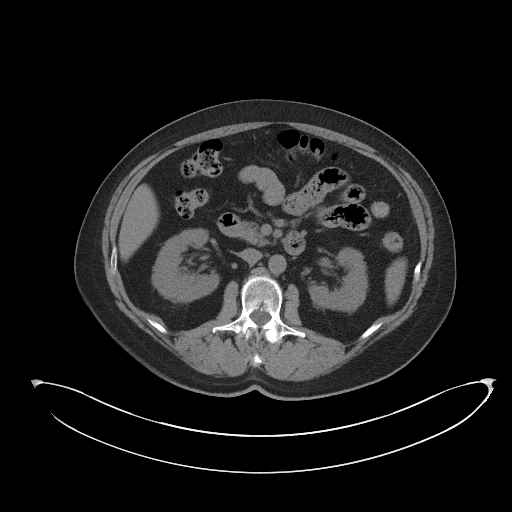
[im 64/98  bone]
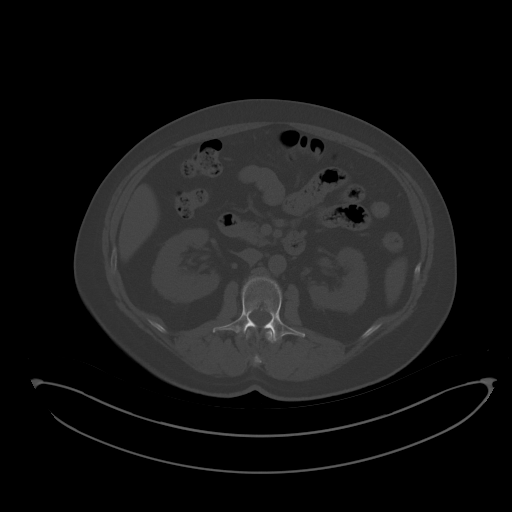
[im 72/98  soft-tissue]
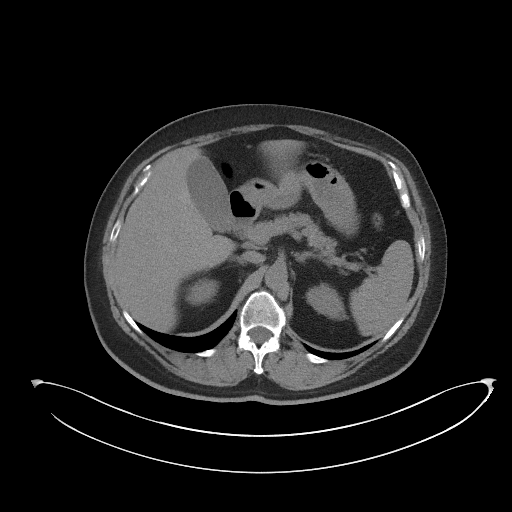
[im 76/98  soft-tissue]
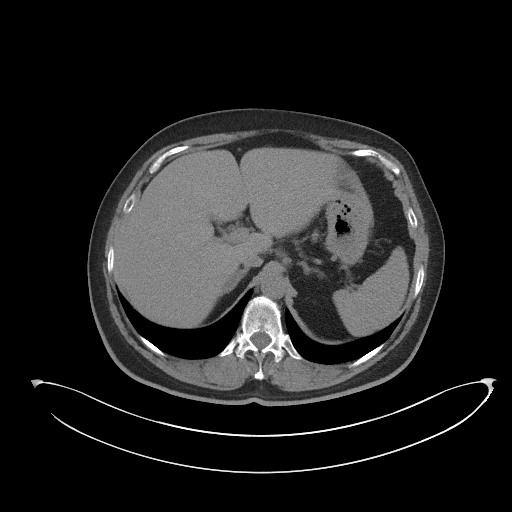
[im 85/98  soft-tissue]
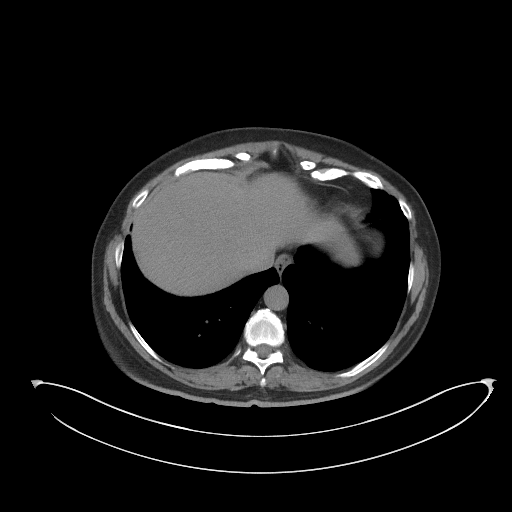
[im 93/98  soft-tissue]
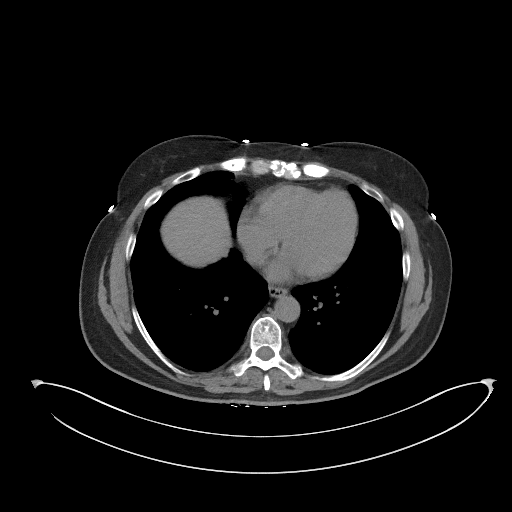

[Series 5: coronal st · coronal · 0.87mm/px · 3 of 96 slices shown]
[im 32/96  soft-tissue]
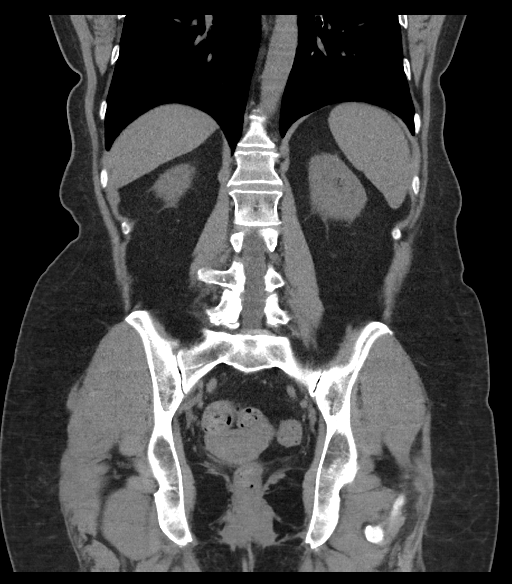
[im 43/96  soft-tissue]
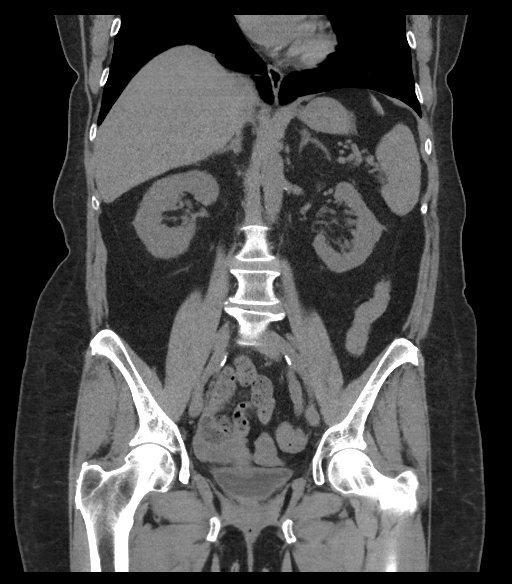
[im 53/96  soft-tissue]
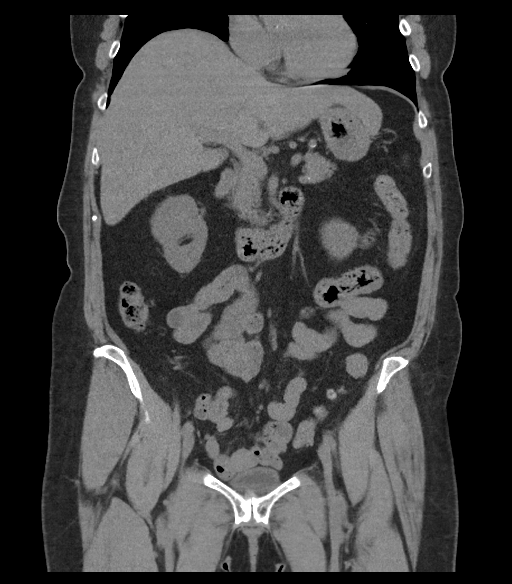

[16 of 46 positions shown; findings below may reference images not displayed]

FINDINGS: Lower chest: No acute abnormality.

Hepatobiliary: No suspicious hepatic lesion on this noncontrast
examination. Gallbladder is unremarkable. No biliary ductal
dilation.

Pancreas: No pancreatic ductal dilation or evidence of acute
inflammation.

Spleen: No splenomegaly or focal splenic lesion.

Adrenals/Urinary Tract: Bilateral adrenal glands are within normal
limits. No hydronephrosis. No renal, ureteral or bladder calculi
identified. Mild wall thickening of an incompletely distended
urinary bladder.

Stomach/Bowel: No enteric contrast was administered. Stomach is
unremarkable for degree of distension. No pathologic dilation of
small large bowel. The appendix and terminal ileum appear normal.
Left-sided colonic diverticulosis without findings of acute
diverticulitis.

Vascular/Lymphatic: Aortic and branch vessel atherosclerosis without
abdominal aortic aneurysm. No pathologically enlarged abdominal or
pelvic lymph nodes. Pelvic phleboliths.

Reproductive: Uterus and bilateral adnexa are unremarkable.

Other: No significant abdominopelvic free fluid.

Musculoskeletal: Multilevel degenerative changes of the spine. Mild
degenerative changes of the bilateral hips and SI joints. Chronic
changes of the pubic symphysis.
IMPRESSION: 1. No acute abdominopelvic findings. Specifically, no evidence of
obstructive uropathy.
2. Left-sided colonic diverticulosis without findings of acute
diverticulitis.
3. Mild wall thickening of an incompletely distended urinary
bladder, which may represent cystitis. Correlate with urinalysis.
4.  Aortic Atherosclerosis ([id number]).

## 2024-11-06 ENCOUNTER — Encounter: Admitting: Family Medicine
